# Patient Record
Sex: Female | Born: 1985 | Race: Black or African American | Hispanic: No | Marital: Single | Smoking: Never smoker
Health system: Southern US, Community
[De-identification: ages and names within clinical notes are randomized; demographics above are authoritative.]

---

## 2012-09-06 ENCOUNTER — Encounter (HOSPITAL_BASED_OUTPATIENT_CLINIC_OR_DEPARTMENT_OTHER): Payer: Self-pay | Admitting: *Deleted

## 2012-09-06 ENCOUNTER — Emergency Department (HOSPITAL_BASED_OUTPATIENT_CLINIC_OR_DEPARTMENT_OTHER): Payer: Medicaid Other

## 2012-09-06 ENCOUNTER — Emergency Department (HOSPITAL_BASED_OUTPATIENT_CLINIC_OR_DEPARTMENT_OTHER)
Admission: EM | Admit: 2012-09-06 | Discharge: 2012-09-06 | Disposition: A | Discharge status: Home / Self Care | Payer: Medicaid Other | Attending: Emergency Medicine | Admitting: Emergency Medicine

## 2012-09-06 ENCOUNTER — Other Ambulatory Visit (HOSPITAL_COMMUNITY): Payer: Medicaid Other

## 2012-09-06 DIAGNOSIS — R55 Syncope and collapse: Secondary | ICD-10-CM | POA: Insufficient documentation

## 2012-09-06 DIAGNOSIS — O210 Mild hyperemesis gravidarum: Secondary | ICD-10-CM | POA: Insufficient documentation

## 2012-09-06 DIAGNOSIS — O30002 Twin pregnancy, unspecified number of placenta and unspecified number of amniotic sacs, second trimester: Secondary | ICD-10-CM

## 2012-09-06 DIAGNOSIS — O9989 Other specified diseases and conditions complicating pregnancy, childbirth and the puerperium: Secondary | ICD-10-CM | POA: Insufficient documentation

## 2012-09-06 LAB — CBC WITH DIFFERENTIAL/PLATELET
Basophils Absolute: 0 10*3/uL (ref 0.0–0.1)
Eosinophils Relative: 1 % (ref 0–5)
HCT: 33.7 % — ABNORMAL LOW (ref 36.0–46.0)
Lymphocytes Relative: 24 % (ref 12–46)
MCV: 87.8 fL (ref 78.0–100.0)
Monocytes Absolute: 0.7 10*3/uL (ref 0.1–1.0)
RDW: 12.8 % (ref 11.5–15.5)
WBC: 10.7 10*3/uL — ABNORMAL HIGH (ref 4.0–10.5)

## 2012-09-06 LAB — BASIC METABOLIC PANEL
BUN: 6 mg/dL (ref 6–23)
CO2: 23 mEq/L (ref 19–32)
Chloride: 102 mEq/L (ref 96–112)
Glucose, Bld: 75 mg/dL (ref 70–99)
Potassium: 3.9 mEq/L (ref 3.5–5.1)

## 2012-09-06 LAB — URINALYSIS, ROUTINE W REFLEX MICROSCOPIC
Bilirubin Urine: NEGATIVE
Glucose, UA: NEGATIVE mg/dL
Hgb urine dipstick: NEGATIVE
Ketones, ur: NEGATIVE mg/dL
Nitrite: NEGATIVE
pH: 7 (ref 5.0–8.0)

## 2012-09-06 MED ORDER — SODIUM CHLORIDE 0.9 % IV SOLN
Freq: Once | INTRAVENOUS | Status: AC
  Administered 2012-09-06: 1000 mL via INTRAVENOUS

## 2012-09-06 MED ORDER — ONDANSETRON 4 MG PO TBDP: 4.0000 mg | ORAL_TABLET | Freq: Three times a day (TID) | ORAL | Status: AC | PRN

## 2012-09-06 NOTE — ED Notes (Signed)
Pt c/o vomiting with preg. With syncopal episode this am

## 2012-09-06 NOTE — ED Notes (Signed)
Pt return from US via stretcher.

## 2012-09-06 NOTE — ED Provider Notes (Signed)
Medical screening examination/treatment/procedure(s) were performed by non-physician practitioner and as supervising physician I was immediately available for consultation/collaboration.  Marzetta Lanza, MD 09/06/12 2314 

## 2012-09-06 NOTE — ED Notes (Signed)
PA at bedside.

## 2012-09-06 NOTE — ED Provider Notes (Signed)
History     CSN: [id number]  Arrival date & time 09/06/12  1478   First MD Initiated Contact with Patient 09/06/12 1834      Chief Complaint  Patient presents with  . Emesis During Pregnancy  . Loss of Consciousness    (Consider location/radiation/quality/duration/timing/severity/associated sxs/prior treatment) Patient is a 27 y.o. female presenting with syncope. The history is provided by the patient. No language interpreter was used.  Loss of Consciousness  This is a new problem. The problem occurs constantly. The problem has not changed since onset.There was no loss of consciousness. Pertinent negatives include abdominal pain. She has tried nothing for the symptoms. The treatment provided no relief.    History reviewed. No pertinent past medical history.  History reviewed. No pertinent past surgical history.  History reviewed. No pertinent family history.  History  Substance Use Topics  . Smoking status: Never Smoker   . Smokeless tobacco: Not on file  . Alcohol Use: No    OB History   Grav Para Term Preterm Abortions TAB SAB Ect Mult Living                  Review of Systems  Cardiovascular: Positive for syncope.  Gastrointestinal: Negative for abdominal pain.  All other systems reviewed and are negative.    Allergies  Review of patient's allergies indicates no known allergies.  Home Medications   Current Outpatient Rx  Name  Route  Sig  Dispense  Refill  . prenatal vitamin w/FE, FA (PRENATAL 1 + 1) 27-1 MG TABS   Oral   Take 1 tablet by mouth daily at 12 noon.           BP 111/65  Pulse 96  Temp(Src) 98.5 F (36.9 C) (Oral)  Resp 16  Ht 5\' 6"  (1.676 m)  Wt 110 lb (49.896 kg)  BMI 17.76 kg/m2  SpO2 100%  LMP 07/26/2012  Physical Exam  Nursing note and vitals reviewed. Constitutional: She is oriented to person, place, and time. She appears well-developed.  HENT:  Head: Normocephalic.  Right Ear: External ear normal.  Eyes: Conjunctivae  are normal. Pupils are equal, round, and reactive to light.  Neck: Normal range of motion. Neck supple.  Cardiovascular: Normal rate and normal heart sounds.   Pulmonary/Chest: Effort normal.  Abdominal: Soft.  Musculoskeletal: Normal range of motion.  Neurological: She is alert and oriented to person, place, and time.  Skin: Skin is warm.  Psychiatric: She has a normal mood and affect.    ED Course  Procedures (including critical care time)  Labs Reviewed  CBC WITH DIFFERENTIAL - Abnormal; Notable for the following:    WBC 10.7 (*)    RBC 3.84 (*)    Hemoglobin 11.9 (*)    HCT 33.7 (*)    All other components within normal limits  URINALYSIS, ROUTINE W REFLEX MICROSCOPIC - Abnormal; Notable for the following:    APPearance CLOUDY (*)    All other components within normal limits  PREGNANCY, URINE - Abnormal; Notable for the following:    Preg Test, Ur POSITIVE (*)    All other components within normal limits  BASIC METABOLIC PANEL   No results found.   No diagnosis found.    MDM  Pt given Iv fluids x 2 liters,  Pt feels much better.   Ultrasound shows twins at 14 weeks.   Pt given rx for prenatal vitamins and zofran   Pt advised schedule prenatl care,  To [facility] hospital  if any further problems.     Lonia Skinner [place], PA-C 09/06/12 2129

## 2012-09-06 NOTE — ED Notes (Signed)
Patient transported to Ultrasound 

## 2012-09-06 NOTE — ED Notes (Signed)
Pt discharged home and verbalized understanding to follow up with OBGYN.

## 2014-05-13 ENCOUNTER — Telehealth: Payer: Self-pay | Admitting: *Deleted

## 2014-08-06 IMAGING — US US OB EACH ADDL GEST<[ID]
1 series · 14 of 28 positions shown · non-contrast
Comparison: None.

CLINICAL DATA: Syncope, positive pregnancy test

TWIN OBSTETRICAL ULTRASOUND <14 WKS:

[Series 1: us ob each addl gest<(id) · 0.26mm/px · 14 of 78 slices shown]
[im 3/78]
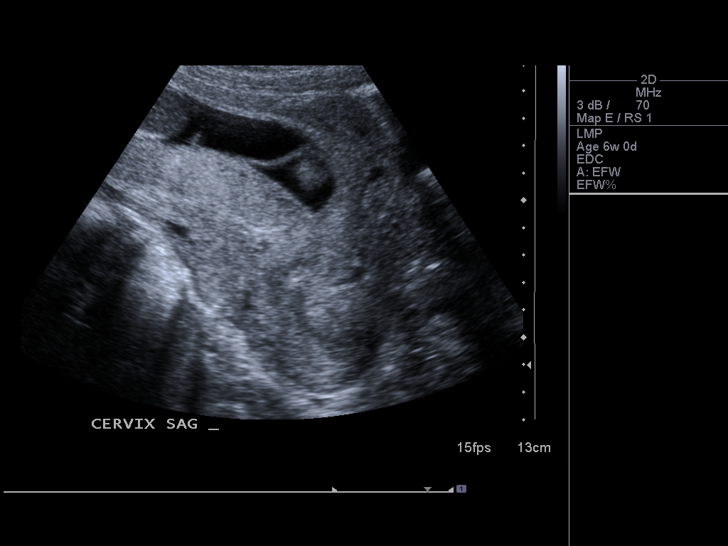
[im 9/78]
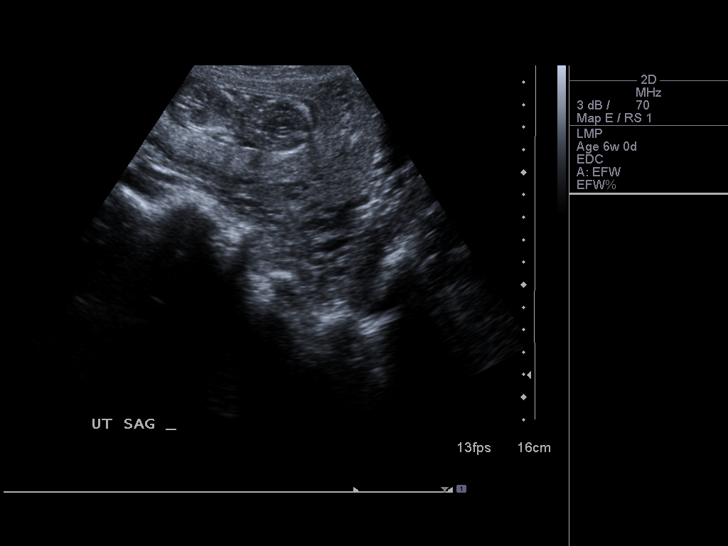
[im 15/78]
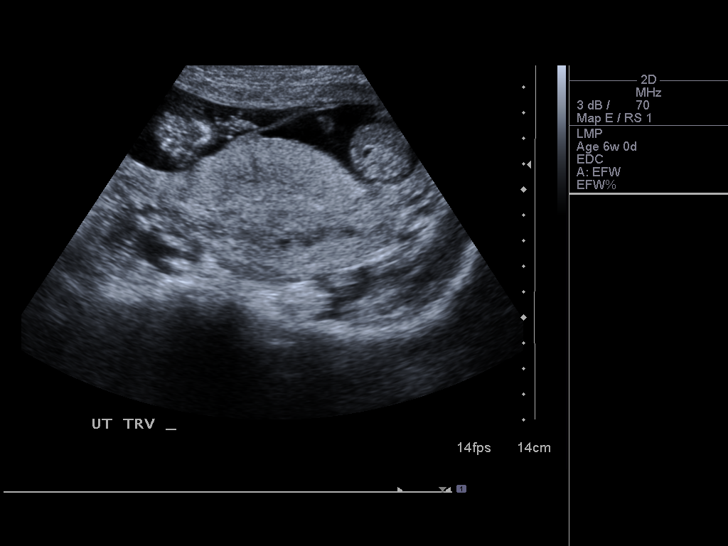
[im 20/78]
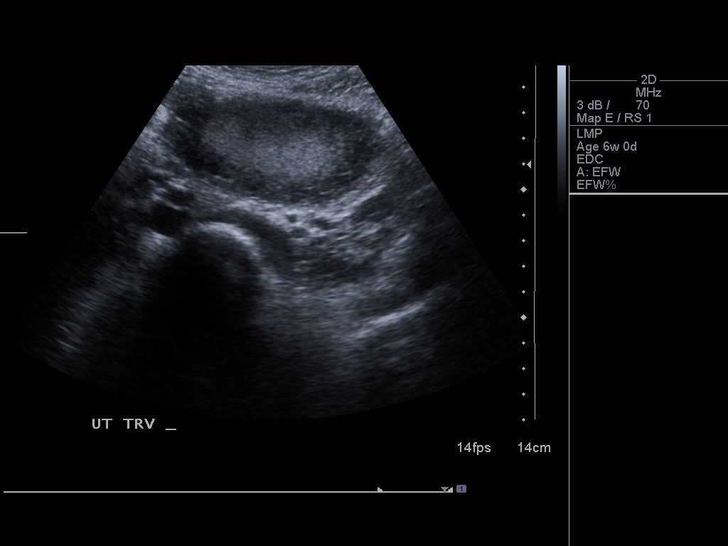
[im 26/78]
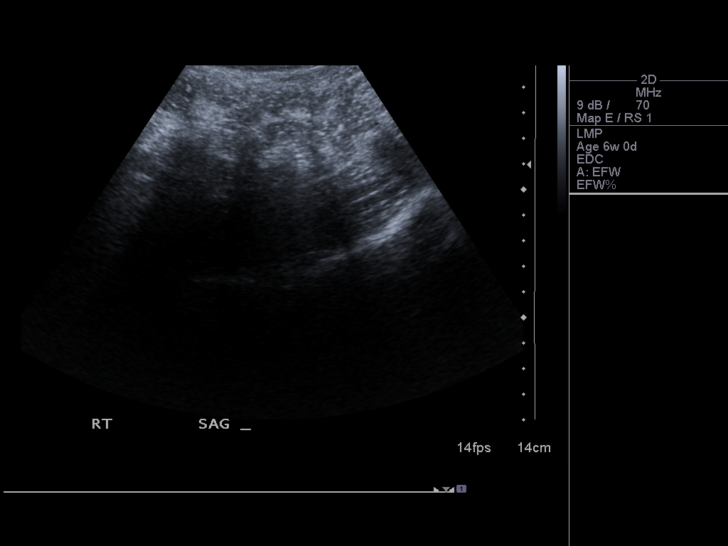
[im 32/78]
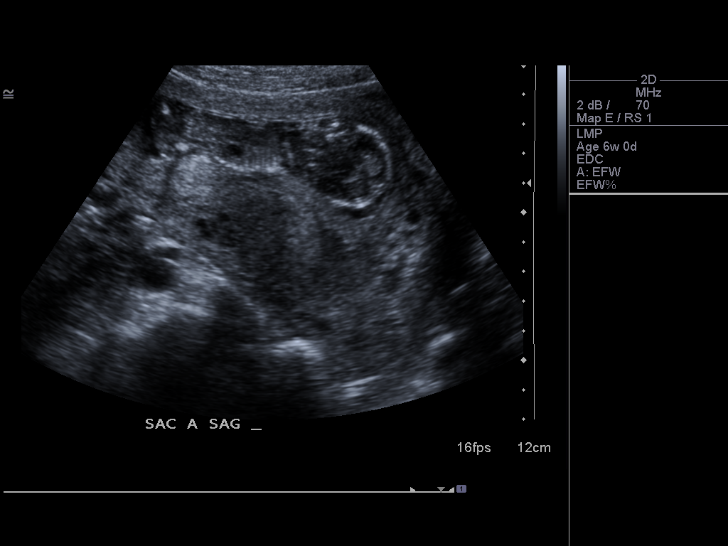
[im 38/78]
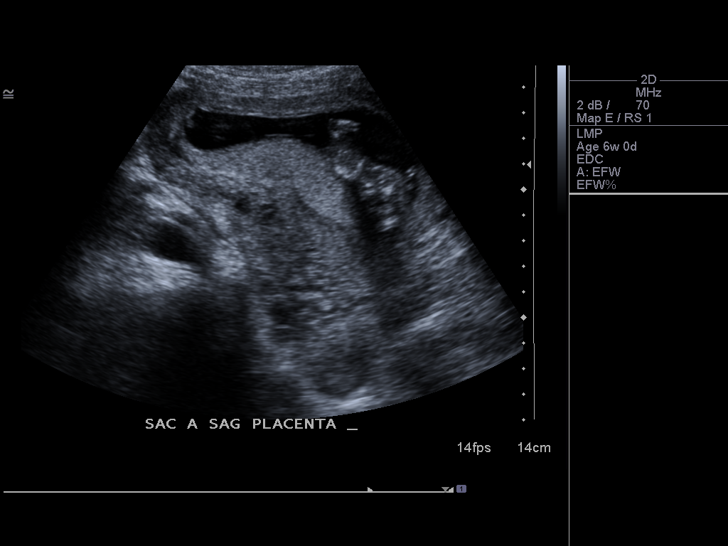
[im 43/78]
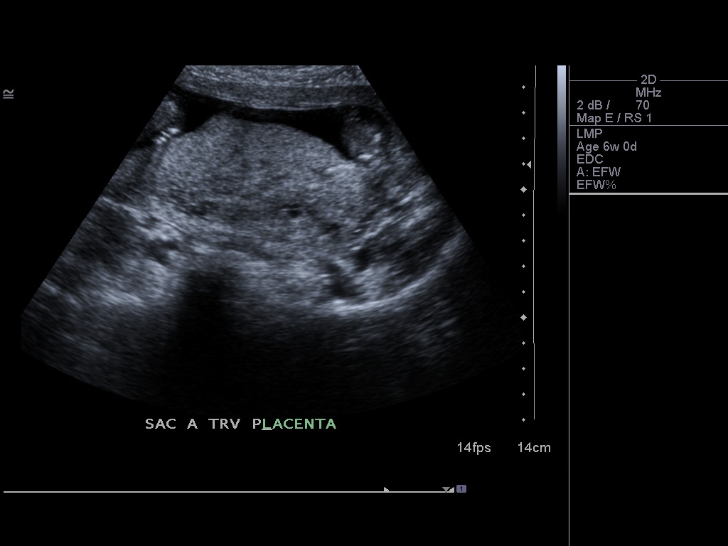
[im 49/78]
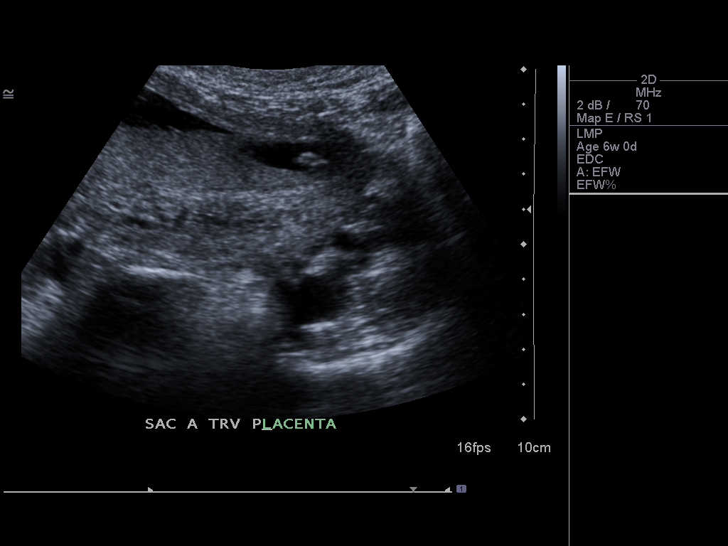
[im 55/78]
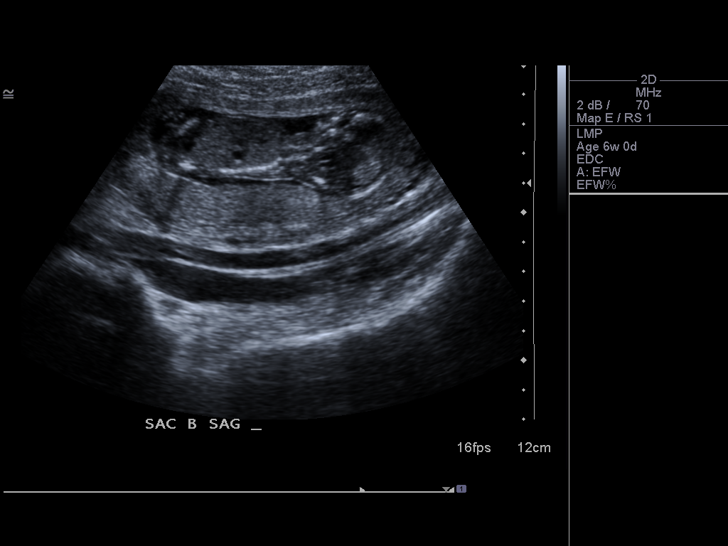
[im 60/78]
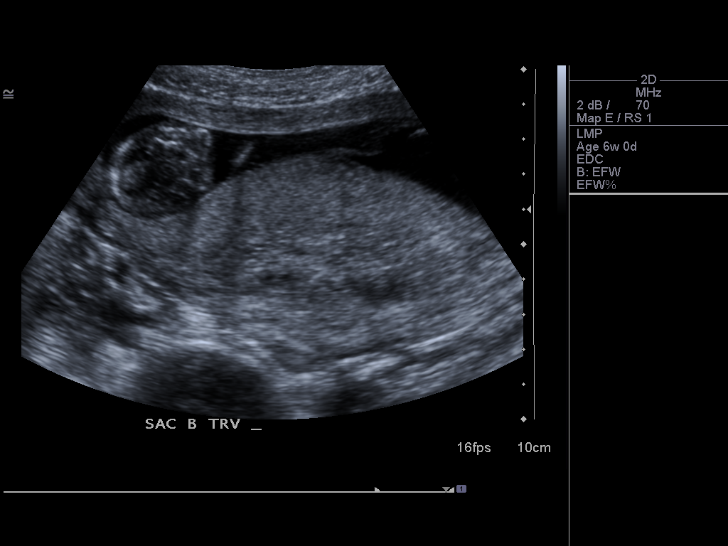
[im 66/78]
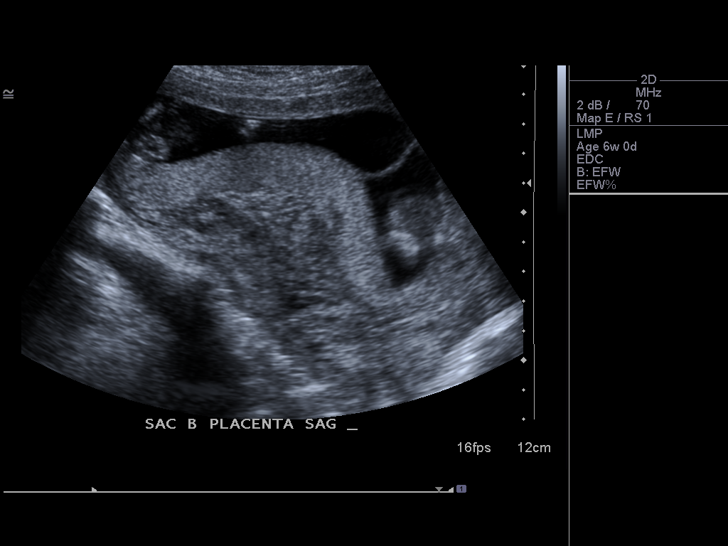
[im 72/78]
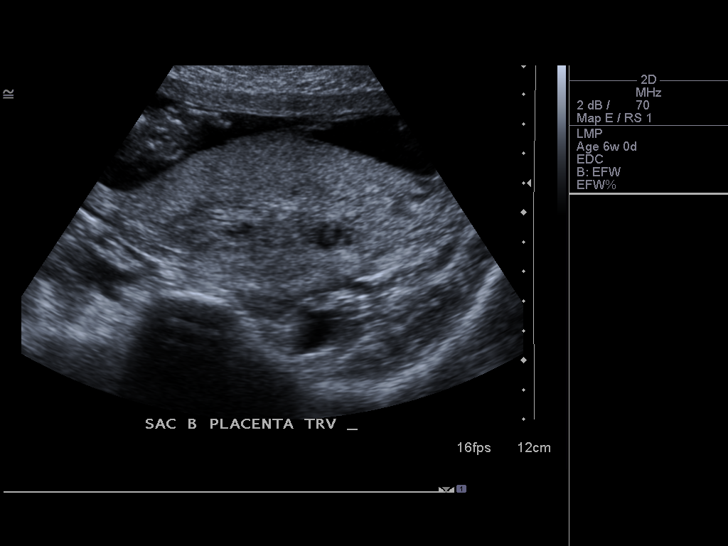
[im 78/78]
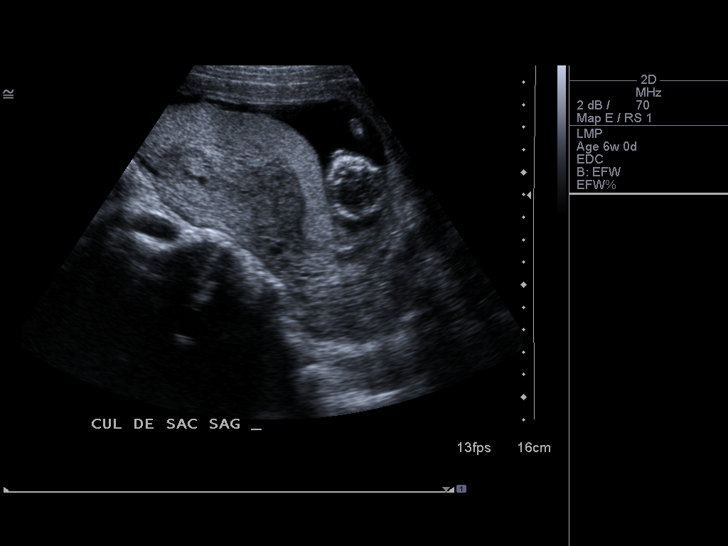

[14 of 28 positions shown; findings below may reference images not displayed]

TWIN 1
Intrauterine gestational sac:  Visualized/normal in shape.
Yolk sac: Not visualized
Embryo: Visualized
Cardiac Activity: Visualized
Heart Rate: 160 bpm

CRL:  79mm       14w    0d          US EDC: 03/07/13

TWIN 2
Intrauterine gestational sac:   Visualized/normal in shape.
Yolk sac: Not visualized
Embryo:  Visualized
Cardiac Activity: Visualized
Heart Rate: 153 bpm

CRL:  76mm       13w    4d          US EDC: 03/10/13

Maternal uterus/adnexae:
The ovaries are not visualized.
IMPRESSION: Living intrauterine twin pregnancy with configuration suggesting
diamniotic dichorionic twin pregnancy.  Average ultrasound age 13
weeks 5 days, EDC by ultrasound 03/09/2013.  No acute abnormality.

## 2015-07-11 ENCOUNTER — Telehealth: Payer: Self-pay | Admitting: Physician Assistant

## 2016-08-09 NOTE — Telephone Encounter (Signed)
error
